# Patient Record
Sex: Female | Born: 1980 | Race: White | Hispanic: No | Marital: Married | Smoking: Never smoker
Health system: Southern US, Community
[De-identification: ages and names within clinical notes are randomized; demographics above are authoritative.]

## PROBLEM LIST (undated history)

## (undated) ENCOUNTER — Inpatient Hospital Stay (HOSPITAL_COMMUNITY): Payer: Self-pay

## (undated) DIAGNOSIS — Z87440 Personal history of urinary (tract) infections: Secondary | ICD-10-CM

## (undated) DIAGNOSIS — Z789 Other specified health status: Secondary | ICD-10-CM

## (undated) DIAGNOSIS — O34519 Maternal care for incarceration of gravid uterus, unspecified trimester: Secondary | ICD-10-CM

## (undated) DIAGNOSIS — O34539 Maternal care for retroversion of gravid uterus, unspecified trimester: Secondary | ICD-10-CM

## (undated) HISTORY — DX: Maternal care for incarceration of gravid uterus, unspecified trimester: O34.519

## (undated) HISTORY — DX: Maternal care for retroversion of gravid uterus, unspecified trimester: O34.539

## (undated) HISTORY — PX: WISDOM TOOTH EXTRACTION: SHX21

## (undated) HISTORY — PX: SKIN GRAFT: SHX250

## (undated) HISTORY — DX: Personal history of urinary (tract) infections: Z87.440

## (undated) HISTORY — PX: APPENDECTOMY: SHX54

---

## 2007-03-17 ENCOUNTER — Observation Stay (HOSPITAL_COMMUNITY): Admission: EM | Admit: 2007-03-17 | Discharge: 2007-03-18 | Payer: Self-pay | Admitting: Emergency Medicine

## 2007-03-17 ENCOUNTER — Encounter (INDEPENDENT_AMBULATORY_CARE_PROVIDER_SITE_OTHER): Payer: Self-pay | Admitting: General Surgery

## 2007-03-17 ENCOUNTER — Ambulatory Visit (HOSPITAL_COMMUNITY): Admission: RE | Admit: 2007-03-17 | Discharge: 2007-03-17 | Payer: Self-pay | Admitting: Family Medicine

## 2009-10-10 ENCOUNTER — Ambulatory Visit (HOSPITAL_COMMUNITY): Admission: RE | Admit: 2009-10-10 | Discharge: 2009-10-10 | Payer: Self-pay | Admitting: Family Medicine

## 2010-07-04 ENCOUNTER — Inpatient Hospital Stay (HOSPITAL_COMMUNITY)
Admission: AD | Admit: 2010-07-04 | Discharge: 2010-07-08 | Payer: Self-pay | Source: Home / Self Care | Attending: Obstetrics & Gynecology | Admitting: Obstetrics & Gynecology

## 2010-07-08 LAB — RPR: RPR Ser Ql: NONREACTIVE

## 2010-07-08 LAB — CBC
Hemoglobin: 11.5 g/dL — ABNORMAL LOW (ref 12.0–15.0)
MCH: 30.5 pg (ref 26.0–34.0)
MCHC: 34.4 g/dL (ref 30.0–36.0)
Platelets: 217 10*3/uL (ref 150–400)
RBC: 3.77 MIL/uL — ABNORMAL LOW (ref 3.87–5.11)
WBC: 10.5 10*3/uL (ref 4.0–10.5)

## 2010-07-09 LAB — CBC
Hemoglobin: 9.9 g/dL — ABNORMAL LOW (ref 12.0–15.0)
MCHC: 33.3 g/dL (ref 30.0–36.0)
MCV: 90.8 fL (ref 78.0–100.0)
Platelets: 188 10*3/uL (ref 150–400)

## 2010-10-29 NOTE — Op Note (Signed)
Morgan Lawrence, Morgan Lawrence               ACCOUNT NO.:  [id number]   MEDICAL RECORD NO.:  [id number]          PATIENT TYPE:  INP   LOCATION:  A330                          FACILITY:  [facility]   PHYSICIAN:  Dalia Heading, M.D.  DATE OF BIRTH:  [birth year]   DATE OF PROCEDURE:  03/17/2007  DATE OF DISCHARGE:                               OPERATIVE REPORT   PREOPERATIVE DIAGNOSIS:  Acute appendicitis.   POSTOPERATIVE DIAGNOSIS:  Acute appendicitis.   PROCEDURE:  Laparoscopic appendectomy.   SURGEON:  Dr. Franky Macho.   ANESTHESIA:  General endotracheal.   INDICATIONS:  The patient is a 30 year old white female who presents  with of right lower quadrant abdominal pain secondary to acute  appendicitis.  This was this was confirmed by CT scan of the abdomen and  pelvis.  The risks and benefits of the procedure including bleeding,  infection, the possibility an open procedure were fully explained to the  patient, gave informed consent.   PROCEDURE NOTE:  The patient was placed in supine position.  After  induction of general endotracheal anesthesia, the abdomen was prepped  and draped using usual sterile technique with Chloraprep.  Surgical site  confirmation was performed.   An infraumbilical incision was made down to the fascia.  The Veress  needle was introduced to the abdominal cavity and confirmation of  placement was done using the saline drop test.  The abdomen was then  insufflated to 16 mmHg pressure.  11 mm trocar was introduced into the  abdominal cavity under direct visualization without difficulty.  The  patient was placed deeper Trendelenburg position.  Additional 11-mm  trocar was placed in suprapubic region and a 5-mm trocar was placed left  lower quadrant region.  The appendix was visualized and noted to be  retrocecal in nature.  The distal half the appendix was noted to be  inflamed and the vessels injected.  There was no evidence of  perforation.  The mesoappendix was  divided using the harmonic scalpel.  The vascular Endo-GIA was placed across the base of the appendix and  fired.  The appendix was removed without difficulty using EndoCatch bag.  The staple line was inspected, noted to be normal limits.  All fluid and  air were then evacuated from the abdominal cavity prior to move the  trocars.   All wounds were irrigated normal saline.  All wounds were dressed with  0.5 cm Sensorcaine.  The infraumbilical fascia as well as suprapubic  fascia reapproximated using 0 Vicryl interrupted sutures.  All skin  incisions were closed using staples.  Neosporin ointment and dry sterile  dressings were applied.   All tape and needle counts correct end the procedure.  The patient was  extubated in the operating room went back to recovery room awake in  stable condition.  Complications none.   SPECIMEN:  Appendix.   BLOOD LOSS:  Minimal.      Dalia Heading, M.D.  Electronically Signed     MAJ/MEDQ  D:  03/17/2007  T:  03/18/2007  Job:  [id number]   cc:   Madelin Rear.  Sherwood Gambler, MD  Fax: [phone]

## 2011-03-27 LAB — DIFFERENTIAL
Basophils Absolute: 0
Lymphocytes Relative: 48 — ABNORMAL HIGH
Lymphs Abs: 3.3
Monocytes Absolute: 0.5
Monocytes Relative: 7
Neutro Abs: 2.9

## 2011-03-27 LAB — URINALYSIS, ROUTINE W REFLEX MICROSCOPIC
Glucose, UA: NEGATIVE
Ketones, ur: NEGATIVE
Leukocytes, UA: NEGATIVE
Nitrite: NEGATIVE
Urobilinogen, UA: 0.2

## 2011-03-27 LAB — COMPREHENSIVE METABOLIC PANEL
ALT: 13
Albumin: 4.4
Alkaline Phosphatase: 42
CO2: 28
GFR calc non Af Amer: >60
Glucose, Bld: 96
Sodium: 138
Total Bilirubin: 0.5
Total Protein: 7.6

## 2011-03-27 LAB — URINE MICROSCOPIC-ADD ON

## 2011-03-27 LAB — CBC
HCT: 36.3
Hemoglobin: 12.3
MCV: 88.5

## 2011-03-27 LAB — PREGNANCY, URINE: Preg Test, Ur: NEGATIVE

## 2012-02-23 LAB — OB RESULTS CONSOLE RPR: RPR: NONREACTIVE

## 2012-02-23 LAB — OB RESULTS CONSOLE HEPATITIS B SURFACE ANTIGEN: Hepatitis B Surface Ag: NEGATIVE

## 2012-02-23 LAB — OB RESULTS CONSOLE GC/CHLAMYDIA: Chlamydia: NEGATIVE

## 2012-02-26 ENCOUNTER — Encounter (HOSPITAL_COMMUNITY): Payer: Self-pay

## 2012-02-26 ENCOUNTER — Inpatient Hospital Stay (HOSPITAL_COMMUNITY)
Admission: AD | Admit: 2012-02-26 | Discharge: 2012-02-26 | Disposition: A | Discharge status: Home / Self Care | Payer: 59 | Source: Ambulatory Visit | Attending: Obstetrics and Gynecology | Admitting: Obstetrics and Gynecology

## 2012-02-26 DIAGNOSIS — O99891 Other specified diseases and conditions complicating pregnancy: Secondary | ICD-10-CM | POA: Insufficient documentation

## 2012-02-26 DIAGNOSIS — R109 Unspecified abdominal pain: Secondary | ICD-10-CM | POA: Insufficient documentation

## 2012-02-26 DIAGNOSIS — R339 Retention of urine, unspecified: Secondary | ICD-10-CM | POA: Insufficient documentation

## 2012-02-26 HISTORY — DX: Other specified health status: Z78.9

## 2012-02-26 LAB — URINALYSIS, ROUTINE W REFLEX MICROSCOPIC
Glucose, UA: NEGATIVE mg/dL
Leukocytes, UA: NEGATIVE
Specific Gravity, Urine: 1.01 (ref 1.005–1.030)
pH: 5.5 (ref 5.0–8.0)

## 2012-02-26 LAB — URINE MICROSCOPIC-ADD ON: WBC, UA: NONE SEEN WBC/hpf (ref <3)

## 2012-02-26 NOTE — MAU Note (Signed)
I&O cath done using sterile technique.  1200 cc's clear yellow urine returned.

## 2012-02-26 NOTE — MAU Note (Signed)
Patient states she has been unable to urinate since 1800 last night. Is having abdominal pain. Denies any bleeding or discharge. 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

## 2012-02-26 NOTE — MAU Provider Note (Signed)
History     CSN: [id number]  Arrival date and time: 02/26/12 1325   None     Chief Complaint  Patient presents with  . Abdominal Pain   HPI 31 y.o. G2P1001 at [redacted]w[redacted]d with difficulty urinating x a few weeks, states her uterus is tilted, has not been able to urinate since about 6 PM last night.    Past Medical History  Diagnosis Date  . No pertinent past medical history     Past Surgical History  Procedure Date  . Appendectomy   . Skin graft   . Wisdom tooth extraction     History reviewed. No pertinent family history.  History  Substance Use Topics  . Smoking status: Never Smoker   . Smokeless tobacco: Not on file  . Alcohol Use: No    Allergies: Allergies not on file  No prescriptions prior to admission    Review of Systems  Constitutional: Negative.   Respiratory: Negative.   Cardiovascular: Negative.   Gastrointestinal: Positive for abdominal pain (relieved with emptying bladder). Negative for nausea, vomiting, diarrhea and constipation.  Genitourinary: Negative for dysuria, urgency, frequency, hematuria and flank pain.       Negative for vaginal bleeding, Positive for urinary hesitancy and retention  Musculoskeletal: Negative.   Neurological: Negative.   Psychiatric/Behavioral: Negative.    Physical Exam   Blood pressure 103/62, pulse 81, temperature 98.4 F (36.9 C), temperature source Oral, resp. rate 16, height 5\' 6"  (1.676 m), weight 128 lb 3.2 oz (58.151 kg), SpO2 99.00%.  Physical Exam  Nursing note and vitals reviewed. Constitutional: She is oriented to person, place, and time. She appears well-developed and well-nourished. No distress.  Cardiovascular: Normal rate.   Respiratory: Effort normal.  GI: Soft. There is no tenderness.  Musculoskeletal: Normal range of motion.  Neurological: She is alert and oriented to person, place, and time.  Skin: Skin is warm and dry.  Psychiatric: She has a normal mood and affect.    MAU Course    Procedures  Results for orders placed during the hospital encounter of 02/26/12 (from the past 24 hour(s))  URINALYSIS, ROUTINE W REFLEX MICROSCOPIC     Status: Abnormal   Collection Time   02/26/12  2:20 PM      Component Value Range   Color, Urine YELLOW  YELLOW   APPearance CLEAR  CLEAR   Specific Gravity, Urine 1.010  1.005 - 1.030   pH 5.5  5.0 - 8.0   Glucose, UA NEGATIVE  NEGATIVE mg/dL   Hgb urine dipstick SMALL (*) NEGATIVE   Bilirubin Urine NEGATIVE  NEGATIVE   Ketones, ur NEGATIVE  NEGATIVE mg/dL   Protein, ur NEGATIVE  NEGATIVE mg/dL   Urobilinogen, UA 0.2  0.0 - 1.0 mg/dL   Nitrite NEGATIVE  NEGATIVE   Leukocytes, UA NEGATIVE  NEGATIVE  URINE MICROSCOPIC-ADD ON     Status: Normal   Collection Time   02/26/12  2:20 PM      Component Value Range   Squamous Epithelial / LPF RARE  RARE   WBC, UA    <3 WBC/hpf   Value: NO FORMED ELEMENTS SEEN ON URINE MICROSCOPIC EXAMINATION   RBC / HPF 0-2  <3 RBC/hpf   Bacteria, UA RARE  RARE   Bladder drained of 1200 ml of urine upon arrival to MAU with in and out cath  Assessment and Plan  31 y.o. G2P1001 at [redacted]w[redacted]d Urinary retention - foley catheter placed, follow up in office on Monday  [facility]  02/26/2012, 2:35 PM

## 2012-02-28 ENCOUNTER — Encounter (HOSPITAL_COMMUNITY): Payer: Self-pay | Admitting: *Deleted

## 2012-02-28 ENCOUNTER — Encounter (HOSPITAL_COMMUNITY): Payer: Self-pay | Admitting: Anesthesiology

## 2012-02-28 ENCOUNTER — Inpatient Hospital Stay (HOSPITAL_COMMUNITY): Payer: 59

## 2012-02-28 ENCOUNTER — Inpatient Hospital Stay (HOSPITAL_COMMUNITY)
Admission: AD | Admit: 2012-02-28 | Discharge: 2012-02-28 | Disposition: A | Discharge status: Home / Self Care | Payer: 59 | Source: Ambulatory Visit | Attending: Obstetrics and Gynecology | Admitting: Obstetrics and Gynecology

## 2012-02-28 ENCOUNTER — Encounter (HOSPITAL_COMMUNITY)
Admission: AD | Disposition: A | Discharge status: Home / Self Care | Payer: Self-pay | Source: Ambulatory Visit | Attending: Obstetrics and Gynecology

## 2012-02-28 ENCOUNTER — Inpatient Hospital Stay (HOSPITAL_COMMUNITY): Payer: 59 | Admitting: Anesthesiology

## 2012-02-28 DIAGNOSIS — Z3480 Encounter for supervision of other normal pregnancy, unspecified trimester: Secondary | ICD-10-CM

## 2012-02-28 DIAGNOSIS — O34519 Maternal care for incarceration of gravid uterus, unspecified trimester: Secondary | ICD-10-CM

## 2012-02-28 HISTORY — PX: CERVICAL CERCLAGE: SHX1329

## 2012-02-28 LAB — CBC
HCT: 29.2 % — ABNORMAL LOW (ref 36.0–46.0)
MCV: 86.1 fL (ref 78.0–100.0)
Platelets: 192 10*3/uL (ref 150–400)
RBC: 3.39 MIL/uL — ABNORMAL LOW (ref 3.87–5.11)
WBC: 7.5 10*3/uL (ref 4.0–10.5)

## 2012-02-28 SURGERY — CERCLAGE, CERVIX, VAGINAL APPROACH
Anesthesia: Spinal | Wound class: Clean Contaminated

## 2012-02-28 MED ORDER — LIDOCAINE IN DEXTROSE 5-7.5 % IV SOLN
INTRAVENOUS | Status: DC | PRN
  Administered 2012-02-28: 60 mg via INTRATHECAL

## 2012-02-28 MED ORDER — FENTANYL CITRATE 0.05 MG/ML IJ SOLN: 25.0000 ug | INTRAMUSCULAR | Status: DC | PRN

## 2012-02-28 MED ORDER — FAMOTIDINE IN NACL 20-0.9 MG/50ML-% IV SOLN
20.0000 mg | Freq: Once | INTRAVENOUS | Status: AC
  Administered 2012-02-28: 20 mg via INTRAVENOUS
  Filled 2012-02-28: qty 50

## 2012-02-28 MED ORDER — CITRIC ACID-SODIUM CITRATE 334-500 MG/5ML PO SOLN
30.0000 mL | Freq: Once | ORAL | Status: AC
  Administered 2012-02-28: 30 mL via ORAL
  Filled 2012-02-28: qty 15

## 2012-02-28 MED ORDER — KETOROLAC TROMETHAMINE 30 MG/ML IJ SOLN: 15.0000 mg | Freq: Once | INTRAMUSCULAR | Status: DC | PRN

## 2012-02-28 MED ORDER — LIDOCAINE IN DEXTROSE 5-7.5 % IV SOLN
INTRAVENOUS | Status: AC
  Filled 2012-02-28: qty 2

## 2012-02-28 MED ORDER — LACTATED RINGERS IV SOLN
INTRAVENOUS | Status: DC
  Administered 2012-02-28: 05:00:00 via INTRAVENOUS

## 2012-02-28 MED ORDER — LACTATED RINGERS IV SOLN
INTRAVENOUS | Status: DC | PRN
  Administered 2012-02-28 (×2): via INTRAVENOUS

## 2012-02-28 SURGICAL SUPPLY — 20 items
CATH FOLEY 2WAY SLVR 30CC 16FR (CATHETERS) IMPLANT
CATH ROBINSON RED A/P 16FR (CATHETERS) IMPLANT
CLOTH BEACON ORANGE TIMEOUT ST (SAFETY) ×2 IMPLANT
COUNTER NEEDLE 1200 MAGNETIC (NEEDLE) IMPLANT
GLOVE ECLIPSE 7.0 STRL STRAW (GLOVE) ×4 IMPLANT
GOWN PREVENTION PLUS LG XLONG (DISPOSABLE) ×2 IMPLANT
GOWN PREVENTION PLUS XLARGE (GOWN DISPOSABLE) ×2 IMPLANT
NEEDLE MAYO .5 CIRCLE (NEEDLE) IMPLANT
NEEDLE SPNL 22GX3.5 QUINCKE BK (NEEDLE) IMPLANT
NS IRRIG 1000ML POUR BTL (IV SOLUTION) ×2 IMPLANT
PACK VAGINAL MINOR WOMEN LF (CUSTOM PROCEDURE TRAY) ×2 IMPLANT
PAD OB MATERNITY 4.3X12.25 (PERSONAL CARE ITEMS) ×2 IMPLANT
PAD PREP 24X48 CUFFED NSTRL (MISCELLANEOUS) IMPLANT
SUT PROLENE 1 CT 1 30 (SUTURE) IMPLANT
SYR 30ML LL (SYRINGE) IMPLANT
SYR CONTROL 10ML LL (SYRINGE) IMPLANT
TOWEL OR 17X24 6PK STRL BLUE (TOWEL DISPOSABLE) ×4 IMPLANT
TUBING NON-CON 1/4 X 20 CONN (TUBING) IMPLANT
WATER STERILE IRR 1000ML POUR (IV SOLUTION) IMPLANT
YANKAUER SUCT BULB TIP NO VENT (SUCTIONS) IMPLANT

## 2012-02-28 NOTE — Progress Notes (Signed)
Unable to hear in Triage due to full bladder.

## 2012-02-28 NOTE — MAU Note (Signed)
Pt felt when she leaned back in hosp. Bed that she may be able to void. Bed pan and large blue pad in to pt. Will try to void either on bed pan or large blue pad. Spouse with pt and supportive.

## 2012-02-28 NOTE — MAU Note (Signed)
Pt still unable to void sitting on bedpan or blue pad. Misty Stanley CNM aware.

## 2012-02-28 NOTE — Anesthesia Procedure Notes (Signed)
Spinal  Patient location during procedure: OR Start time: 02/28/2012 5:12 AM Staffing Performed by: anesthesiologist  Preanesthetic Checklist Completed: patient identified, site marked, surgical consent, pre-op evaluation, timeout performed, IV checked, risks and benefits discussed and monitors and equipment checked Spinal Block Patient position: sitting Prep: site prepped and draped and DuraPrep Patient monitoring: heart rate, continuous pulse ox and blood pressure Approach: midline Location: L3-4 Injection technique: single-shot Additional Notes Clear free flow CSF on first attempt.  No paresthesia.  Patient tolerated procedure well.  Jasmine December, MD
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
## 2012-02-28 NOTE — Progress Notes (Signed)
Dr Dareen Piano in. Bimanual done. Pt tol well.

## 2012-02-28 NOTE — MAU Note (Signed)
Dr Dareen Piano called into MAU and would like OB u/s

## 2012-02-28 NOTE — H&P (Signed)
Pt is a 31 year old white female, G2P1001 at 13 weeks who presents to the ER c/o urinary retention.  Pt was seen in the ER 2 days ago with similar complaints. She was noted to have an incarcerated uterus. She 1200 cc in her bladder. She had a catheter placed and was discharged. She had the catheter removed in the office on Friday. An attempt was made to relieve the incarceration without success. PE: Thin white female in NAD.        Abd- non tender, no masses        Pelvic- Cervix deviated anteriorly.                     Uterus- c/w 13 weeks Ultrasound-RV 13 week IUP IMP/ Urinary retention secondary to obstruction from incarcerated uterus. Plan/ To OR for reduction of incarcerated uterus.

## 2012-02-28 NOTE — Progress Notes (Signed)
To OR via stretcher. Spouse to waiting room.

## 2012-02-28 NOTE — MAU Note (Signed)
I was here Thurs. Unable to void. Had catheter inserted and went home with it. Saw Dr Arlyce Dice this am and he tried to tilt the uterus alittle which helped some. Able to void this evening until up to Br at 2300 and unable to void

## 2012-02-28 NOTE — Anesthesia Postprocedure Evaluation (Signed)
Anesthesia Post Note  Patient: Morgan Lawrence  Procedure(s) Performed: Procedure(s) (LRB): CERCLAGE CERVICAL (N/A)  Anesthesia type: Spinal  Patient location: PACU  Post pain: Pain level controlled  Post assessment: Post-op Vital signs reviewed  Last Vitals:  Filed Vitals:   02/28/12 0800  BP: 104/53  Pulse: 66  Temp: 37.3 C  Resp: 20    Post vital signs: Reviewed  Level of consciousness: awake  Complications: No apparent anesthesia complications

## 2012-02-28 NOTE — MAU Provider Note (Signed)
Chief Complaint: Urinary Retention   First Provider Initiated Contact with Patient 02/28/12 0144     SUBJECTIVE HPI: Morgan Lawrence is a 31 y.o. G2P1001 at [redacted]w[redacted]d by LMP who presents to maternity admissions reporting urinary retention.  She was seen in MAU for this on Thursday and had a Foley catheter placed.  Dr Arlyce Dice removed her catheter and manual repositioned her uterus on Friday in the office.  She was able to urinate at home until 9 pm but has been unable to go since then and is starting to become uncomfortable.  She reports some vaginal spotting since the procedure in the office today, but denies cramping, LOF, heavy vaginal bleeding, h/a, dizziness, or fever/chills.     Past Medical History  Diagnosis Date  . No pertinent past medical history    Past Surgical History  Procedure Date  . Appendectomy   . Skin graft   . Wisdom tooth extraction    History   Social History  . Marital Status: Married    Spouse Name: N/A    Number of Children: N/A  . Years of Education: N/A   Occupational History  . Not on file.   Social History Main Topics  . Smoking status: Never Smoker   . Smokeless tobacco: Not on file  . Alcohol Use: No  . Drug Use: No  . Sexually Active: Yes   Other Topics Concern  . Not on file   Social History Narrative  . No narrative on file   No current facility-administered medications on file prior to encounter.   Current Outpatient Prescriptions on File Prior to Encounter  Medication Sig Dispense Refill  . Prenatal Vit-Fe Fumarate-FA (PRENATAL MULTIVITAMIN) TABS Take 1 tablet by mouth at bedtime.       Allergies  Allergen Reactions  . Bactrim (Sulfamethoxazole W-Trimethoprim) Hives  . Penicillins Hives  . Shellfish Allergy Hives    ROS: Pertinent items in HPI  OBJECTIVE Blood pressure 119/48, pulse 74, temperature 98.1 F (36.7 C), temperature source Oral, resp. rate 20, height 5' 5.5" (1.664 m), weight 59.512 kg (131 lb 3.2 oz). GENERAL:  Well-developed, well-nourished female in no acute distress.  HEENT: Normocephalic HEART: normal rate RESP: normal effort ABDOMEN: Soft, non-tender EXTREMITIES: Nontender, no edema NEURO: Alert and oriented Pelvic exam deferred  FHT 160 by doppler  IMAGING US Ob Comp Less 14 Wks  02/28/2012  *RADIOLOGY REPORT*  Clinical Data: Negative F H T  OBSTETRIC <14 WK ULTRASOUND  Technique:  Transabdominal ultrasound was performed for evaluation of the gestation as well as the maternal uterus and adnexal regions.  Comparison:  None.  Intrauterine gestational sac: Visualized/normal in shape. Yolk sac: Not identified Embryo: Identified Cardiac Activity: Identified Heart Rate: 160 bpm  CRL:  6.97 mm  13 w  2 d            [place] EDC: 09/02/2012  Maternal uterus/Adnexae: No subchorionic hemorrhage.  Normal sonographic appearance to the ovaries.  Uterus is retroflexed.  IMPRESSION: Single intrauterine gestation with cardiac activity documented. Estimated age of 13 weeks 2 days by crown-rump length.  Recommend followup with non-emergent complete OB 14+ wk US examination for fetal biometric evaluation and anatomic survey if not already scheduled.   Original Report Authenticated By: Waneta Martins, M.D.     ASSESSMENT Incarcerated uterus @13 .[redacted] weeks gestation  PLAN Foley catheter placed--800 ml yellow urine  Called Dr Dareen Piano to discuss assessment and findings OB U/S ordered Dr Dareen Piano to MAU to see pt and  assume care Pt to OR for uterine manipulation/repositioning under anesthesia    Medication List     As of 02/28/2012  3:27 AM    ASK your doctor about these medications         prenatal multivitamin Tabs   Take 1 tablet by mouth at bedtime.         Sharen Counter Certified Nurse-Midwife 02/28/2012  3:27 AM

## 2012-02-28 NOTE — Transfer of Care (Signed)
Immediate Anesthesia Transfer of Care Note  Patient: Morgan Lawrence  Procedure(s) Performed: Procedure(s) (LRB) with comments: CERCLAGE CERVICAL (N/A) - Exam Under Anesthesia  Patient Location: PACU  Anesthesia Type: Spinal  Level of Consciousness: awake, alert  and oriented  Airway & Oxygen Therapy: Patient Spontanous Breathing  Post-op Assessment: Report given to PACU RN  Post vital signs: Reviewed and stable  Complications: No apparent anesthesia complications

## 2012-03-01 ENCOUNTER — Encounter (HOSPITAL_COMMUNITY): Payer: Self-pay | Admitting: Obstetrics and Gynecology

## 2012-03-05 NOTE — Op Note (Signed)
NAMEANISSAH, KEMPER NO.:  [id number]  MEDICAL RECORD NO.:  [id number]  LOCATION:  [facility]                          FACILITY:  [facility]  PHYSICIAN:  Malva Limes, M.D.    DATE OF BIRTH:  1980-07-03  DATE OF PROCEDURE:  02/28/2012 DATE OF DISCHARGE:  02/28/2012                              OPERATIVE REPORT   PREOPERATIVE DIAGNOSIS:  Incarcerated uterus at 13 weeks estimated gestational age.  POSTOPERATIVE DIAGNOSIS:  Incarcerated uterus at 13 weeks estimated gestational age.  PROCEDURE:  Inversion of uterus and resolution of incarceration.  SURGEON:  Malva Limes, MD  ANESTHESIA:  Spinal.  ANTIBIOTICS:  None.  DRAINS:  Red rubber catheter to bladder.  COMPLICATIONS:  None.  ESTIMATED BLOOD LOSS:  None.  PROCEDURE:  The patient was taken to the operating room, where a spinal anesthetic was placed without difficulty.  She was then placed in dorsal lithotomy position.  She was prepped and draped in the usual fashion for this procedure.  Her bladder was drained with a red rubber catheter. Pelvic exam was then performed, which revealed the uterus approximately 14 weeks in size.  It was extremely retroverted with the cervix up near the pubic symphysis.  My hand was placed into the posterior vagina.  The uterus was then manually lifted up out of the pelvis and inverted.  The patient was noted to have a prominent sacrum.  Procedure was performed without difficulty.  The patient had very little pain.  Postoperatively, there were fetal heart tones.  The patient was discharged to home. Instructed to call the office with any difficulty voiding.          ______________________________ Malva Limes, M.D.     MA/MEDQ  D:  03/04/2012  T:  03/05/2012  Job:  [id number]

## 2012-06-16 NOTE — L&D Delivery Note (Signed)
Delivery Note At 9:01 PM a viable and healthy female was delivered via Vaginal, Spontaneous Delivery (Presentation: Left Occiput Posterior).  APGAR: 7, 9; weight pending.   Placenta status: Intact, Spontaneous.  Cord: 3 vessels with a loose nuchal cord x 1.  Anesthesia: Epidural  Episiotomy: None Lacerations: None Suture Repair: None Est. Blood Loss (mL): 300 cc  Mom to postpartum.  Baby to nursery-stable.  Navraj Dreibelbis H. 09/02/2012, 9:21 PM

## 2012-08-30 ENCOUNTER — Telehealth (HOSPITAL_COMMUNITY): Payer: Self-pay | Admitting: *Deleted

## 2012-08-30 ENCOUNTER — Encounter (HOSPITAL_COMMUNITY): Payer: Self-pay | Admitting: *Deleted

## 2012-08-30 NOTE — Telephone Encounter (Signed)
Preadmission screen  

## 2012-09-02 ENCOUNTER — Encounter (HOSPITAL_COMMUNITY): Payer: Self-pay

## 2012-09-02 ENCOUNTER — Inpatient Hospital Stay (HOSPITAL_COMMUNITY): Payer: 59 | Admitting: Anesthesiology

## 2012-09-02 ENCOUNTER — Inpatient Hospital Stay (HOSPITAL_COMMUNITY)
Admission: RE | Admit: 2012-09-02 | Discharge: 2012-09-04 | DRG: 775 | Disposition: A | Discharge status: Home / Self Care | Payer: 59 | Source: Ambulatory Visit | Attending: Obstetrics and Gynecology | Admitting: Obstetrics and Gynecology

## 2012-09-02 ENCOUNTER — Encounter (HOSPITAL_COMMUNITY): Payer: Self-pay | Admitting: Anesthesiology

## 2012-09-02 DIAGNOSIS — O34519 Maternal care for incarceration of gravid uterus, unspecified trimester: Principal | ICD-10-CM | POA: Diagnosis present

## 2012-09-02 LAB — CBC
Hemoglobin: 10.9 g/dL — ABNORMAL LOW (ref 12.0–15.0)
MCH: 31.2 pg (ref 26.0–34.0)
MCV: 90.8 fL (ref 78.0–100.0)
RBC: 3.49 MIL/uL — ABNORMAL LOW (ref 3.87–5.11)

## 2012-09-02 LAB — RPR: RPR Ser Ql: NONREACTIVE

## 2012-09-02 MED ORDER — OXYCODONE-ACETAMINOPHEN 5-325 MG PO TABS: 1.0000 | ORAL_TABLET | ORAL | Status: DC | PRN

## 2012-09-02 MED ORDER — FENTANYL 2.5 MCG/ML BUPIVACAINE 1/10 % EPIDURAL INFUSION (WH - ANES)
14.0000 mL/h | INTRAMUSCULAR | Status: DC | PRN
  Administered 2012-09-02: 14 mL/h via EPIDURAL
  Filled 2012-09-02: qty 125

## 2012-09-02 MED ORDER — ONDANSETRON HCL 4 MG PO TABS: 4.0000 mg | ORAL_TABLET | ORAL | Status: DC | PRN

## 2012-09-02 MED ORDER — OXYTOCIN 40 UNITS IN LACTATED RINGERS INFUSION - SIMPLE MED: 62.5000 mL/h | INTRAVENOUS | Status: DC

## 2012-09-02 MED ORDER — SENNOSIDES-DOCUSATE SODIUM 8.6-50 MG PO TABS
2.0000 | ORAL_TABLET | Freq: Every day | ORAL | Status: DC
  Administered 2012-09-02: 2 via ORAL
  Administered 2012-09-03: 1 via ORAL

## 2012-09-02 MED ORDER — METHYLERGONOVINE MALEATE 0.2 MG/ML IJ SOLN: 0.2000 mg | INTRAMUSCULAR | Status: DC | PRN

## 2012-09-02 MED ORDER — ZOLPIDEM TARTRATE 5 MG PO TABS: 5.0000 mg | ORAL_TABLET | Freq: Every evening | ORAL | Status: DC | PRN

## 2012-09-02 MED ORDER — DIPHENHYDRAMINE HCL 50 MG/ML IJ SOLN: 12.5000 mg | INTRAMUSCULAR | Status: DC | PRN

## 2012-09-02 MED ORDER — IBUPROFEN 600 MG PO TABS
600.0000 mg | ORAL_TABLET | Freq: Four times a day (QID) | ORAL | Status: DC
  Administered 2012-09-03 – 2012-09-04 (×5): 600 mg via ORAL
  Filled 2012-09-02 (×5): qty 1

## 2012-09-02 MED ORDER — EPHEDRINE 5 MG/ML INJ
10.0000 mg | INTRAVENOUS | Status: DC | PRN
  Filled 2012-09-02: qty 4

## 2012-09-02 MED ORDER — CITRIC ACID-SODIUM CITRATE 334-500 MG/5ML PO SOLN: 30.0000 mL | ORAL | Status: DC | PRN

## 2012-09-02 MED ORDER — PHENYLEPHRINE 40 MCG/ML (10ML) SYRINGE FOR IV PUSH (FOR BLOOD PRESSURE SUPPORT)
80.0000 ug | PREFILLED_SYRINGE | INTRAVENOUS | Status: DC | PRN
  Filled 2012-09-02: qty 5

## 2012-09-02 MED ORDER — METHYLERGONOVINE MALEATE 0.2 MG PO TABS: 0.2000 mg | ORAL_TABLET | ORAL | Status: DC | PRN

## 2012-09-02 MED ORDER — LIDOCAINE HCL (PF) 1 % IJ SOLN
30.0000 mL | INTRAMUSCULAR | Status: DC | PRN
  Filled 2012-09-02 (×3): qty 30

## 2012-09-02 MED ORDER — DIPHENHYDRAMINE HCL 25 MG PO CAPS: 25.0000 mg | ORAL_CAPSULE | Freq: Four times a day (QID) | ORAL | Status: DC | PRN

## 2012-09-02 MED ORDER — SIMETHICONE 80 MG PO CHEW: 80.0000 mg | CHEWABLE_TABLET | ORAL | Status: DC | PRN

## 2012-09-02 MED ORDER — ONDANSETRON HCL 4 MG/2ML IJ SOLN: 4.0000 mg | INTRAMUSCULAR | Status: DC | PRN

## 2012-09-02 MED ORDER — WITCH HAZEL-GLYCERIN EX PADS: 1.0000 "application " | MEDICATED_PAD | CUTANEOUS | Status: DC | PRN

## 2012-09-02 MED ORDER — TERBUTALINE SULFATE 1 MG/ML IJ SOLN: 0.2500 mg | Freq: Once | INTRAMUSCULAR | Status: DC | PRN

## 2012-09-02 MED ORDER — PRENATAL MULTIVITAMIN CH
1.0000 | ORAL_TABLET | Freq: Every day | ORAL | Status: DC
  Administered 2012-09-03: 1 via ORAL
  Filled 2012-09-02: qty 1

## 2012-09-02 MED ORDER — LACTATED RINGERS IV SOLN
500.0000 mL | INTRAVENOUS | Status: DC | PRN
  Administered 2012-09-02 (×2): 1000 mL via INTRAVENOUS

## 2012-09-02 MED ORDER — ONDANSETRON HCL 4 MG/2ML IJ SOLN: 4.0000 mg | Freq: Four times a day (QID) | INTRAMUSCULAR | Status: DC | PRN

## 2012-09-02 MED ORDER — PHENYLEPHRINE 40 MCG/ML (10ML) SYRINGE FOR IV PUSH (FOR BLOOD PRESSURE SUPPORT): 80.0000 ug | PREFILLED_SYRINGE | INTRAVENOUS | Status: DC | PRN

## 2012-09-02 MED ORDER — ACETAMINOPHEN 325 MG PO TABS: 650.0000 mg | ORAL_TABLET | ORAL | Status: DC | PRN

## 2012-09-02 MED ORDER — OXYTOCIN 40 UNITS IN LACTATED RINGERS INFUSION - SIMPLE MED
1.0000 m[IU]/min | INTRAVENOUS | Status: DC
  Administered 2012-09-02: 2 m[IU]/min via INTRAVENOUS
  Filled 2012-09-02: qty 1000

## 2012-09-02 MED ORDER — DIBUCAINE 1 % RE OINT
1.0000 "application " | TOPICAL_OINTMENT | RECTAL | Status: DC | PRN
  Filled 2012-09-02: qty 28

## 2012-09-02 MED ORDER — EPHEDRINE 5 MG/ML INJ: 10.0000 mg | INTRAVENOUS | Status: DC | PRN

## 2012-09-02 MED ORDER — LANOLIN HYDROUS EX OINT: TOPICAL_OINTMENT | CUTANEOUS | Status: DC | PRN

## 2012-09-02 MED ORDER — OXYTOCIN BOLUS FROM INFUSION
500.0000 mL | INTRAVENOUS | Status: DC
  Administered 2012-09-02: 500 mL via INTRAVENOUS

## 2012-09-02 MED ORDER — LACTATED RINGERS IV SOLN
INTRAVENOUS | Status: DC
  Administered 2012-09-02: 1000 mL via INTRAVENOUS

## 2012-09-02 MED ORDER — TETANUS-DIPHTH-ACELL PERTUSSIS 5-2.5-18.5 LF-MCG/0.5 IM SUSP
0.5000 mL | Freq: Once | INTRAMUSCULAR | Status: AC
  Administered 2012-09-04: 0.5 mL via INTRAMUSCULAR
  Filled 2012-09-02: qty 0.5

## 2012-09-02 MED ORDER — LACTATED RINGERS IV SOLN: 500.0000 mL | Freq: Once | INTRAVENOUS | Status: DC

## 2012-09-02 MED ORDER — IBUPROFEN 600 MG PO TABS: 600.0000 mg | ORAL_TABLET | Freq: Four times a day (QID) | ORAL | Status: DC | PRN

## 2012-09-02 MED ORDER — BUTORPHANOL TARTRATE 1 MG/ML IJ SOLN: 1.0000 mg | INTRAMUSCULAR | Status: DC | PRN

## 2012-09-02 MED ORDER — SODIUM BICARBONATE 8.4 % IV SOLN
INTRAVENOUS | Status: DC | PRN
  Administered 2012-09-02: 5 mL via EPIDURAL

## 2012-09-02 MED ORDER — BENZOCAINE-MENTHOL 20-0.5 % EX AERO
1.0000 "application " | INHALATION_SPRAY | CUTANEOUS | Status: DC | PRN
  Filled 2012-09-02: qty 56

## 2012-09-02 NOTE — H&P (Signed)
Morgan Lawrence is a 32 y.o. female presenting for IOL  32 yo G2P1001 @ 39+6 presents for elective IOL.  Her pregnancy was complicated by uterine incarceration around 14 weeks.  She ended up having to go to the OR for decompression of incarcerated uterus. Otherwise, her pregnancy has been uneventful. History OB History   Grav Para Term Preterm Abortions TAB SAB Ect Mult Living   2 1 1       1      Past Medical History  Diagnosis Date  . No pertinent past medical history   . Hx: UTI (urinary tract infection)   . Retroverted and incarcerated gravid uterus, unspecified as to episode of care    Past Surgical History  Procedure Laterality Date  . Appendectomy    . Skin graft    . Wisdom tooth extraction    . Cervical cerclage  02/28/2012    Procedure: CERCLAGE CERVICAL;  Surgeon: Levi Aland, MD;  Location: [facility];  Service: Gynecology;  Laterality: N/A;  Exam Under Anesthesia, Correction of uterine Incarceration  NOTE: Patient did NOT have a cerclage placed this pregnancy.  She had an EUA for incarcerated uterus.  The PSH is incorrect and cannot be amended   Family History: family history is negative for Other. Social History:  reports that she has never smoked. She does not have any smokeless tobacco history on file. She reports that she does not drink alcohol or use illicit drugs.   Prenatal Transfer Tool  Maternal Diabetes: No Genetic Screening: Normal Maternal Ultrasounds/Referrals: Normal Fetal Ultrasounds or other Referrals:  None Maternal Substance Abuse:  No Significant Maternal Medications:  None Significant Maternal Lab Results:  None Other Comments:  None  ROS: as above  Dilation: 10 Effacement (%): 100 Station: +1 Exam by:: JFoley, RN Blood pressure 105/90, pulse 92, temperature 98.1 F (36.7 C), temperature source Oral, resp. rate 18, height 5\' 5"  (1.651 m), weight 71.668 kg (158 lb), SpO2 100.00%. Exam Physical Exam  Prenatal labs: ABO, Rh: --/--/A NEG, A  NEG (03/20 0715) Antibody: NEG (03/20 0715) Rubella: Immune (09/09 0000) RPR: NON REACTIVE (03/20 0715)  HBsAg: Negative (09/09 0000)  HIV: Non-reactive (09/09 0000)  GBS: Negative (02/25 0000)   Assessment/Plan: 1) Admit 2) Pitocin augmentation 3) Epidural on request   [name] 09/02/2012, 9:25 PM

## 2012-09-02 NOTE — Anesthesia Procedure Notes (Signed)

## 2012-09-02 NOTE — Anesthesia Preprocedure Evaluation (Signed)

## 2012-09-03 ENCOUNTER — Inpatient Hospital Stay (HOSPITAL_COMMUNITY): Admission: AD | Admit: 2012-09-03 | Payer: Self-pay | Source: Ambulatory Visit | Admitting: Obstetrics & Gynecology

## 2012-09-03 LAB — CBC
Hemoglobin: 9.6 g/dL — ABNORMAL LOW (ref 12.0–15.0)
MCHC: 33.9 g/dL (ref 30.0–36.0)
Platelets: 208 10*3/uL (ref 150–400)

## 2012-09-03 LAB — CCBB MATERNAL DONOR DRAW

## 2012-09-03 NOTE — Anesthesia Postprocedure Evaluation (Signed)
  Anesthesia Post-op Note  Patient: Morgan Lawrence  Procedure(s) Performed: * No procedures listed *  Patient Location: PACU  Anesthesia Type:Epidural  Level of Consciousness: awake, alert , oriented and patient cooperative  Airway and Oxygen Therapy: Patient Spontanous Breathing  Post-op Pain: none  Post-op Assessment: Post-op Vital signs reviewed and Patient's Cardiovascular Status Stable  Post-op Vital Signs: Reviewed and stable  Complications: No apparent anesthesia complications

## 2012-09-03 NOTE — Progress Notes (Signed)
Patient is eating, ambulating, voiding.  Pain control is good.  Filed Vitals:   09/02/12 2232 09/02/12 2330 09/03/12 0030 09/03/12 0412  BP: 112/67 115/74 110/71 110/70  Pulse: 104 120 88 78  Temp:  98.2 F (36.8 C) 98.9 F (37.2 C) 98.1 F (36.7 C)  TempSrc:  Oral Oral Oral  Resp:  20 18 18   Height:      Weight:      SpO2:  97% 97% 98%    Fundus firm Perineum without swelling.  Lab Results  Component Value Date   WBC 19.2* 09/03/2012   HGB 9.6* 09/03/2012   HCT 28.3* 09/03/2012   MCV 91.6 09/03/2012   PLT 208 09/03/2012    --/--/A NEG, A NEG (03/20 0715)/RI  A/P Post partum day one.  Routine care.  Expect d/c tomorrow.   Baby is O neg; no need for Rhogam.  Iron otc. Sherial Ebrahim A

## 2012-09-03 NOTE — Discharge Summary (Signed)
Obstetric Discharge Summary Reason for Admission: onset of labor Prenatal Procedures: none Intrapartum Procedures: spontaneous vaginal delivery Postpartum Procedures: none Complications-Operative and Postpartum: none Hemoglobin  Date Value Range Status  09/03/2012 9.6* 12.0 - 15.0 g/dL Final     HCT  Date Value Range Status  09/03/2012 28.3* 36.0 - 46.0 % Final     Discharge Diagnoses: Term Pregnancy-delivered  Discharge Information: Date: 09/03/2012 Activity: pelvic rest Diet: routine Medications: Ibuprofen and Iron Condition: stable Instructions: refer to practice specific booklet Discharge to: home Follow-up Information   Follow up with Almon Hercules., MD. Schedule an appointment as soon as possible for a visit in 4 weeks.   Contact information:   [address] [place] [address] [phone]       Newborn Data: Live born female  Birth Weight: 9 lb 8.6 oz (4325 g) APGAR: 7, 9  Home with mother.  Idris Edmundson A 09/03/2012, 7:47 AM

## 2012-09-04 NOTE — Progress Notes (Signed)
Patient is eating, ambulating, voiding.  Pain control is good.  Filed Vitals:   09/03/12 0030 09/03/12 0412 09/03/12 1217 09/03/12 1823  BP: 110/71 110/70 101/70 109/69  Pulse: 88 78 99 65  Temp: 98.9 F (37.2 C) 98.1 F (36.7 C) 97.7 F (36.5 C) 97.5 F (36.4 C)  TempSrc: Oral Oral Oral Oral  Resp: 18 18 18 18   Height:      Weight:      SpO2: 97% 98% 98% 98%    Fundus firm Perineum without swelling.  Lab Results  Component Value Date   WBC 19.2* 09/03/2012   HGB 9.6* 09/03/2012   HCT 28.3* 09/03/2012   MCV 91.6 09/03/2012   PLT 208 09/03/2012    --/--/A NEG, A NEG (03/20 0715)/RI Baby Neg  A/P Post partum day 2.  Routine care.  Expect d/c today.    Morgan Lawrence A

## 2012-09-06 NOTE — Progress Notes (Signed)
Ur chart review completed.  

## 2012-12-03 ENCOUNTER — Other Ambulatory Visit: Payer: Self-pay | Admitting: Obstetrics and Gynecology

## 2013-02-17 IMAGING — US US OB COMP LESS 14 WK
1 series · 14 of 23 positions shown · non-contrast
Comparison: None.

CLINICAL DATA: Negative F H T

OBSTETRIC <14 WK ULTRASOUND
TECHNIQUE: Transabdominal ultrasound was performed for evaluation
of the gestation as well as the maternal uterus and adnexal
regions.

[Series 1: us ob comp less 14 wks · 14 of 23 slices shown]
[im 1/23]
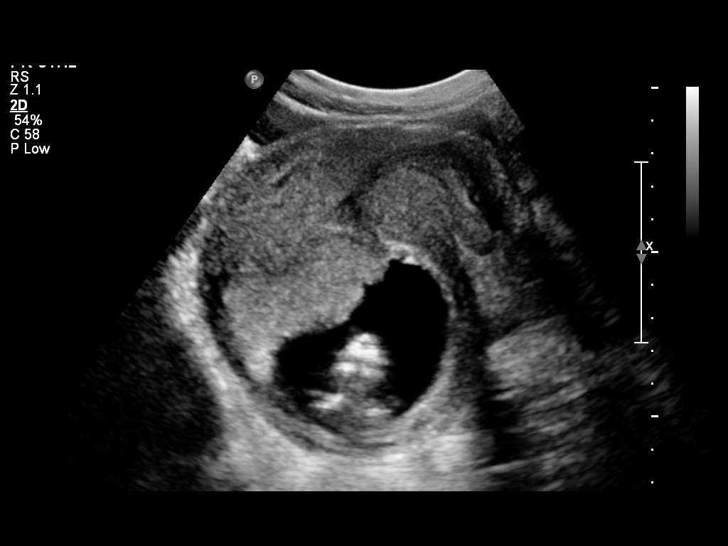
[im 3/23]
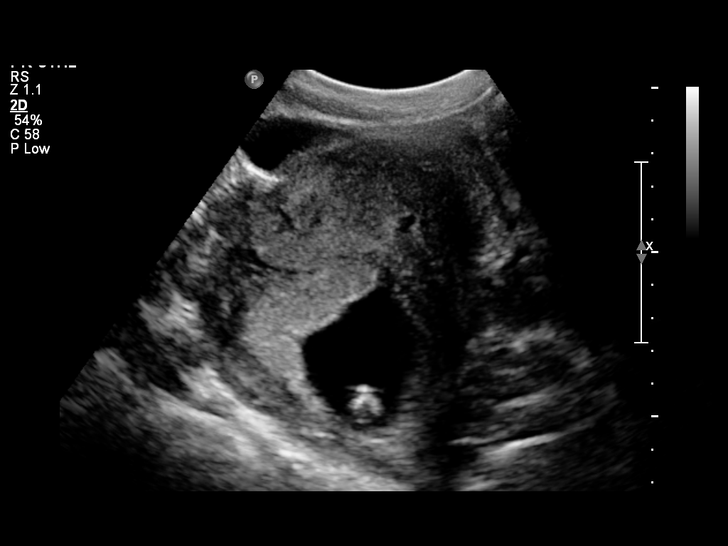
[im 5/23]
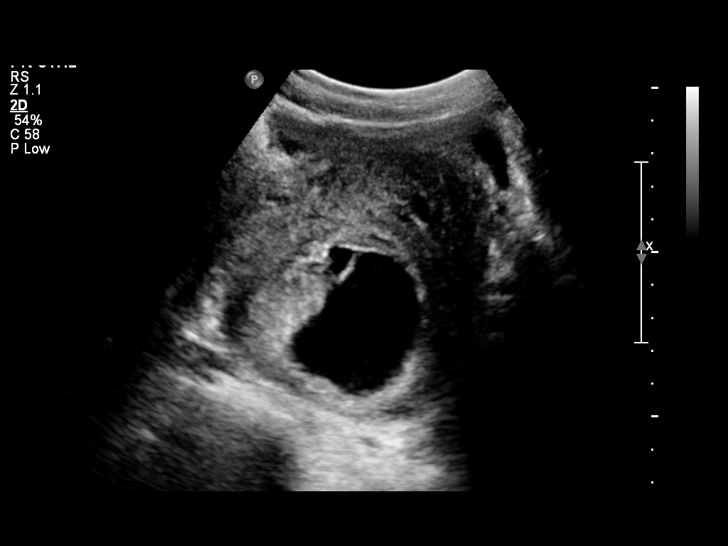
[im 6/23]
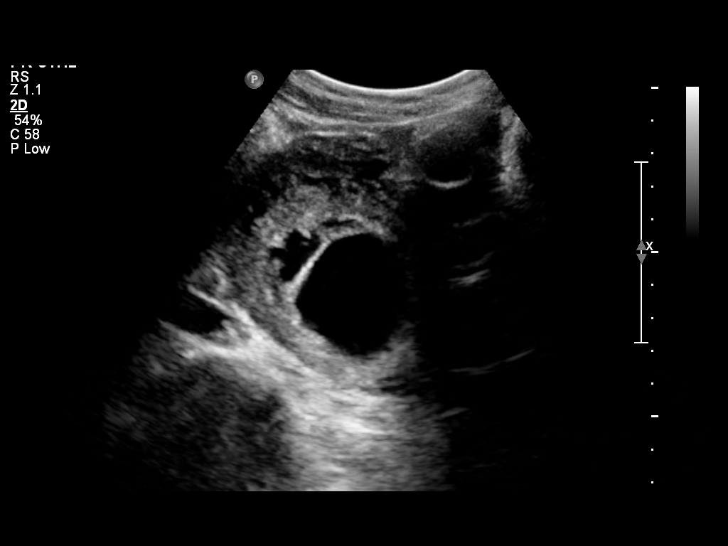
[im 8/23]
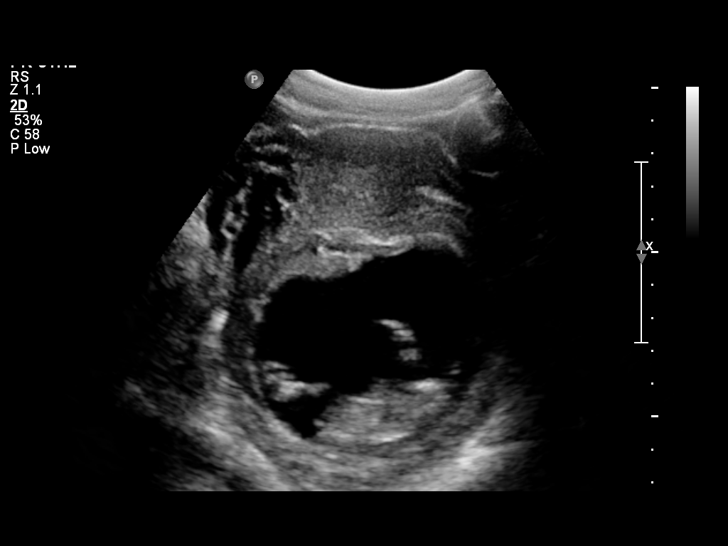
[im 10/23]
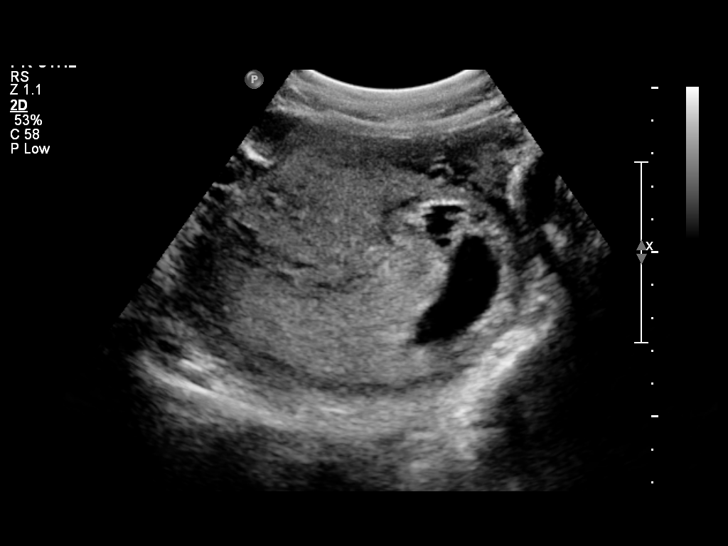
[im 11/23]
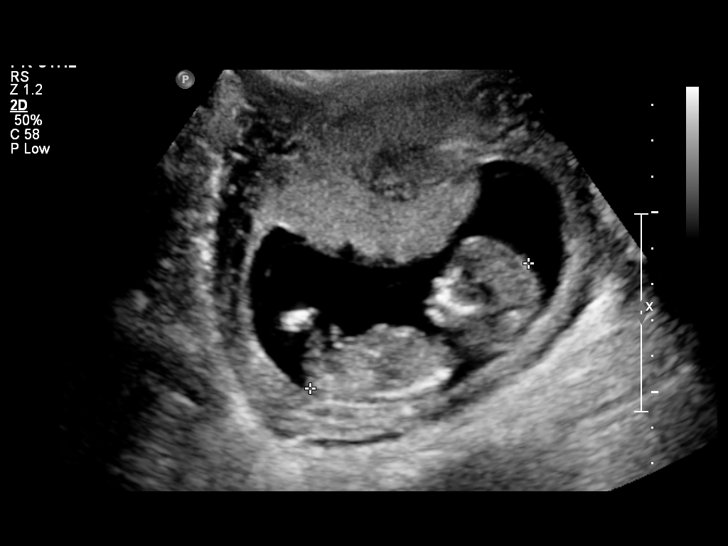
[im 13/23]
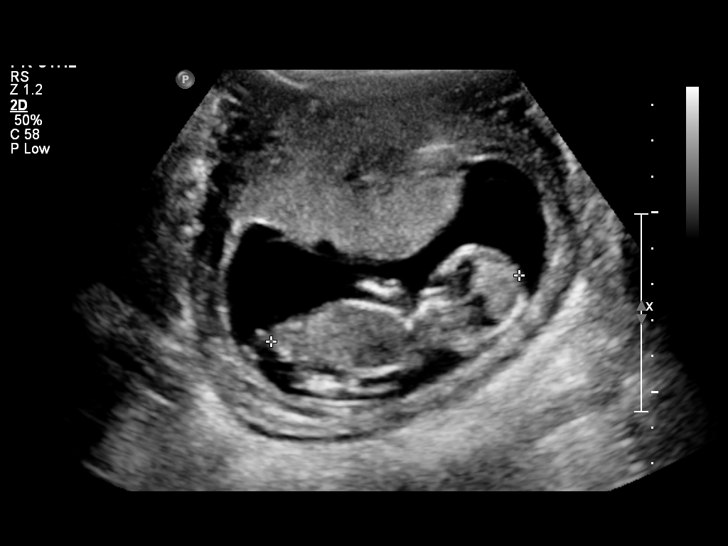
[im 14/23]
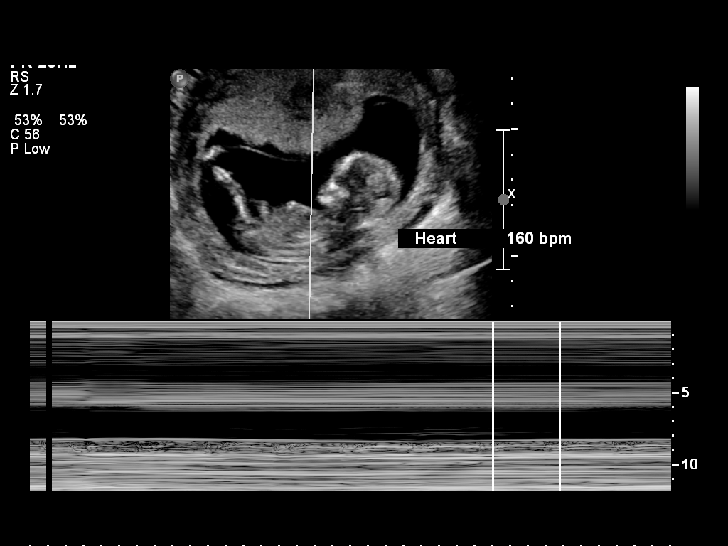
[im 16/23]
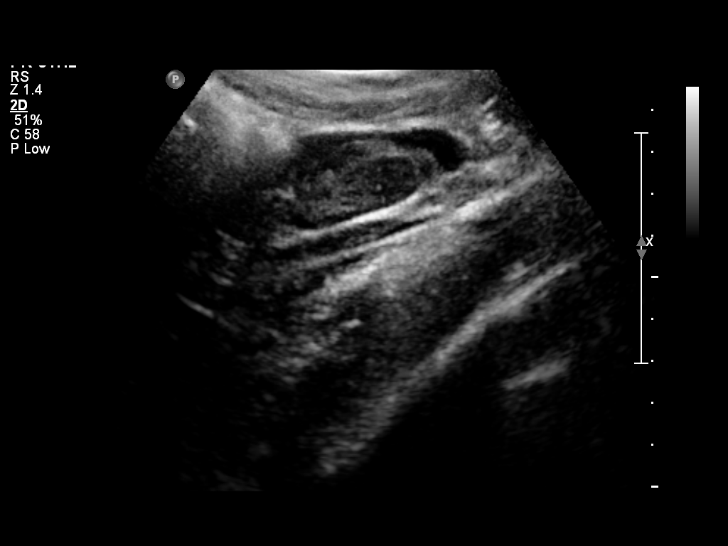
[im 18/23]
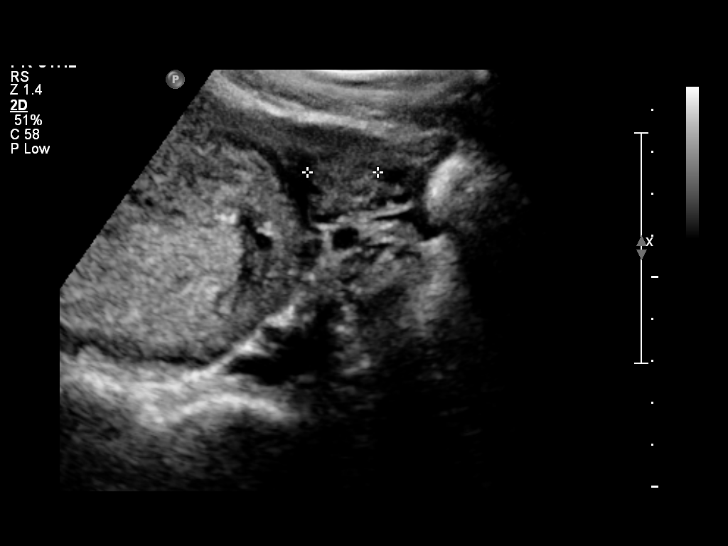
[im 19/23]
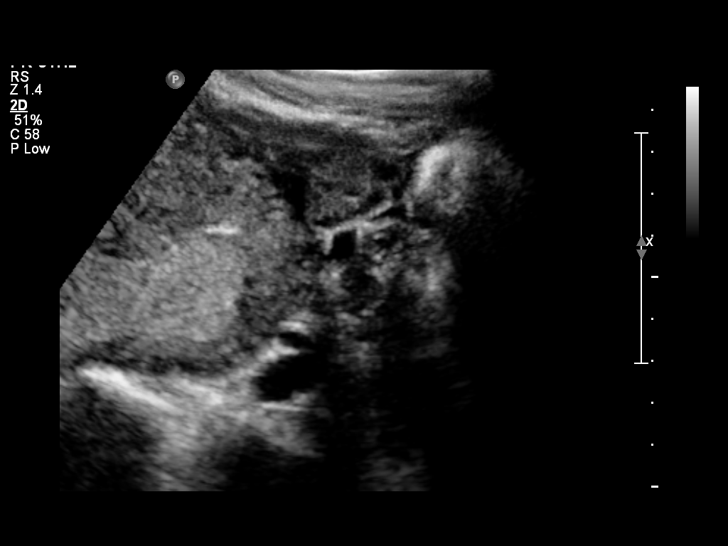
[im 21/23]
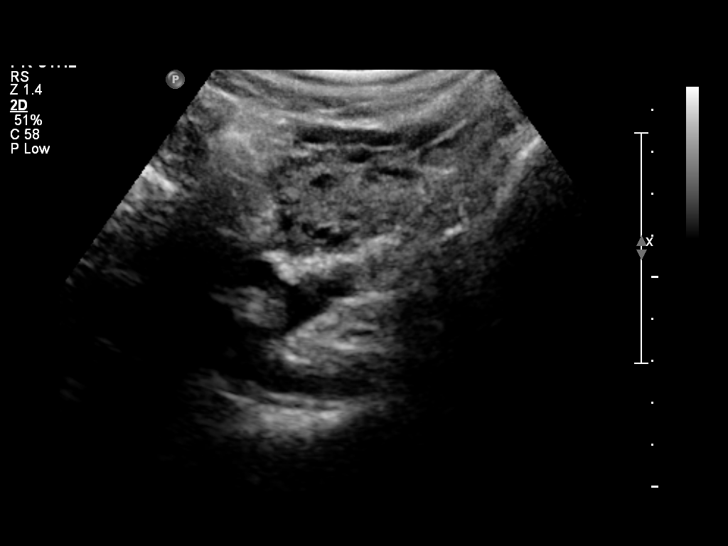
[im 23/23]
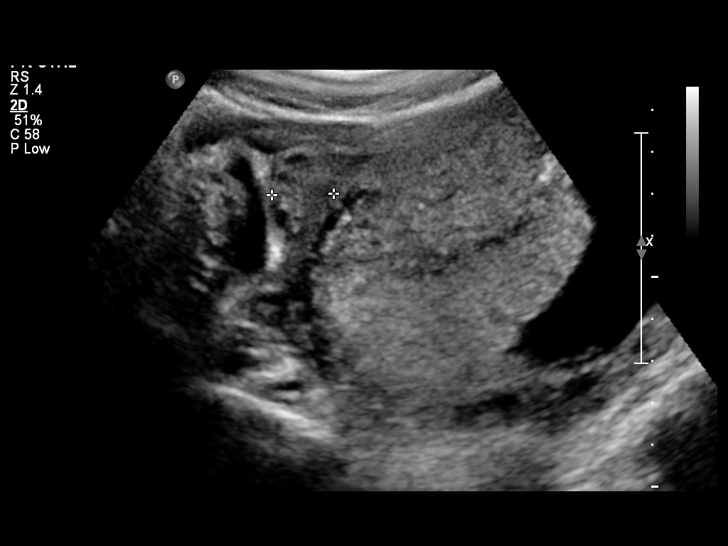

[14 of 23 positions shown; findings below may reference images not displayed]

Intrauterine gestational sac: Visualized/normal in shape.
Yolk sac: Not identified
Embryo: Identified
Cardiac Activity: Identified
Heart Rate: 160 bpm

CRL:  6.97 mm  13 w  2 d            US EDC: 09/02/2012

Maternal uterus/Adnexae:
No subchorionic hemorrhage.  Normal sonographic appearance to the
ovaries.  Uterus is retroflexed.
IMPRESSION: Single intrauterine gestation with cardiac activity documented.
Estimated age of 13 weeks 2 days by crown-rump length.

Recommend followup with non-emergent complete OB 14+ wk US
examination for fetal biometric evaluation and anatomic survey if
not already scheduled.

## 2014-04-17 ENCOUNTER — Encounter (HOSPITAL_COMMUNITY): Payer: Self-pay

## 2015-05-28 ENCOUNTER — Ambulatory Visit (INDEPENDENT_AMBULATORY_CARE_PROVIDER_SITE_OTHER): Payer: 59 | Admitting: Family Medicine

## 2015-05-28 ENCOUNTER — Encounter: Payer: Self-pay | Admitting: Family Medicine

## 2015-05-28 VITALS — Temp 98.7°F | Wt 128.0 lb

## 2015-05-28 DIAGNOSIS — J329 Chronic sinusitis, unspecified: Secondary | ICD-10-CM | POA: Diagnosis not present

## 2015-05-28 MED ORDER — CLARITHROMYCIN 500 MG PO TABS: 500.0000 mg | ORAL_TABLET | Freq: Two times a day (BID) | ORAL | Status: AC

## 2015-05-28 MED ORDER — BENZONATATE 100 MG PO CAPS: 100.0000 mg | ORAL_CAPSULE | Freq: Three times a day (TID) | ORAL | Status: AC | PRN

## 2015-05-28 NOTE — Progress Notes (Signed)
   Subjective:    Patient ID: Morgan Lawrence, female    DOB: [birth year], 34 y.o.   MRN: [id number]  Cough This is a new problem. The current episode started 1 to 4 weeks ago. Associated symptoms include ear pain, a fever, headaches, myalgias, nasal congestion and a sore throat. Associated symptoms comments: Vomiting and diarrhea.   Some fever.  Son had sickness before thanksgiving   Prod cough and gunky  Sig coughing and wheezing  Bad achiness in the muscles and joints  Aching, no sig vomiting   After t giving started with bad cough, at night    Review of Systems  Constitutional: Positive for fever.  HENT: Positive for ear pain and sore throat.   Respiratory: Positive for cough.   Musculoskeletal: Positive for myalgias.  Neurological: Positive for headaches.       Objective:   Physical Exam Alert vitals stable moderate malaise. HEENT moderate his congestion pharynx normal neck supple lungs no wheezes no crackles some bronchial sounds heart regular in rhythm       Assessment & Plan:  Impression post viral subacute rhinosinusitis/bronchitis plan antibiotics prescribed. Symptom care discussed warning signs discussed WSL

## 2015-06-12 ENCOUNTER — Telehealth: Payer: Self-pay | Admitting: Family Medicine

## 2015-06-12 ENCOUNTER — Ambulatory Visit (INDEPENDENT_AMBULATORY_CARE_PROVIDER_SITE_OTHER): Payer: 59 | Admitting: Family Medicine

## 2015-06-12 ENCOUNTER — Encounter: Payer: Self-pay | Admitting: Family Medicine

## 2015-06-12 ENCOUNTER — Ambulatory Visit (HOSPITAL_COMMUNITY)
Admission: RE | Admit: 2015-06-12 | Discharge: 2015-06-12 | Disposition: A | Discharge status: Home / Self Care | Payer: 59 | Source: Ambulatory Visit | Attending: Family Medicine | Admitting: Family Medicine

## 2015-06-12 VITALS — BP 102/70 | Temp 98.3°F | Wt 129.1 lb

## 2015-06-12 DIAGNOSIS — R05 Cough: Secondary | ICD-10-CM

## 2015-06-12 DIAGNOSIS — R0789 Other chest pain: Secondary | ICD-10-CM

## 2015-06-12 DIAGNOSIS — R059 Cough, unspecified: Secondary | ICD-10-CM

## 2015-06-12 DIAGNOSIS — J209 Acute bronchitis, unspecified: Secondary | ICD-10-CM

## 2015-06-12 MED ORDER — LEVOFLOXACIN 500 MG PO TABS: 500.0000 mg | ORAL_TABLET | Freq: Every day | ORAL | Status: AC

## 2015-06-12 NOTE — Progress Notes (Signed)
   Subjective:    Patient ID: Morgan Lawrence, female    DOB: Jan 22, 1981, 34 y.o.   MRN: [id number]  Cough This is a new problem. The current episode started more than 1 month ago. The problem has been unchanged. Associated symptoms include chest pain, ear pain, hemoptysis and a sore throat. Nothing aggravates the symptoms. Treatments tried: antibiotic. The treatment provided no relief.    Hx of baby pneumonia  Bad cough worse at night, not taking suppressan t,  Patient worried about right-sided chest pain. Sharp in nature. Worse with deep breath.  Review of Systems  HENT: Positive for ear pain and sore throat.   Respiratory: Positive for cough and hemoptysis.   Cardiovascular: Positive for chest pain.      Objective:   Physical Exam  Alert mild malaise. Vital stable. Lungs clear heart rare rhythm chest wall tender right anterior and right posterior      Assessment & Plan:  Current impression probable bronchitis with element of chest pain plan antibiotics prescribed. Chest x-ray performed no infiltrate symptoms care discussed WSL

## 2015-06-12 NOTE — Telephone Encounter (Signed)
Pt seen before Christmas, finished all meds, still has cough (producitve), ear pain, sore throat, has a protruding pain feeling when she's breathing, moving from side to side the right side of chest around to her back, sounds "bubbly" she still feels bad, night sweats but no known fever  Please advise

## 2015-06-12 NOTE — Telephone Encounter (Signed)
Pt coughing up green sputum and some blood. Transferred to front to schedule recheck today.

## 2016-06-01 IMAGING — DX DG CHEST 2V
2 series · 2 of 2 positions shown · non-contrast
Comparison: 10/10/2009

CLINICAL DATA: Cough, flu-like symptoms

EXAM:
CHEST  2 VIEW

[chest pa]
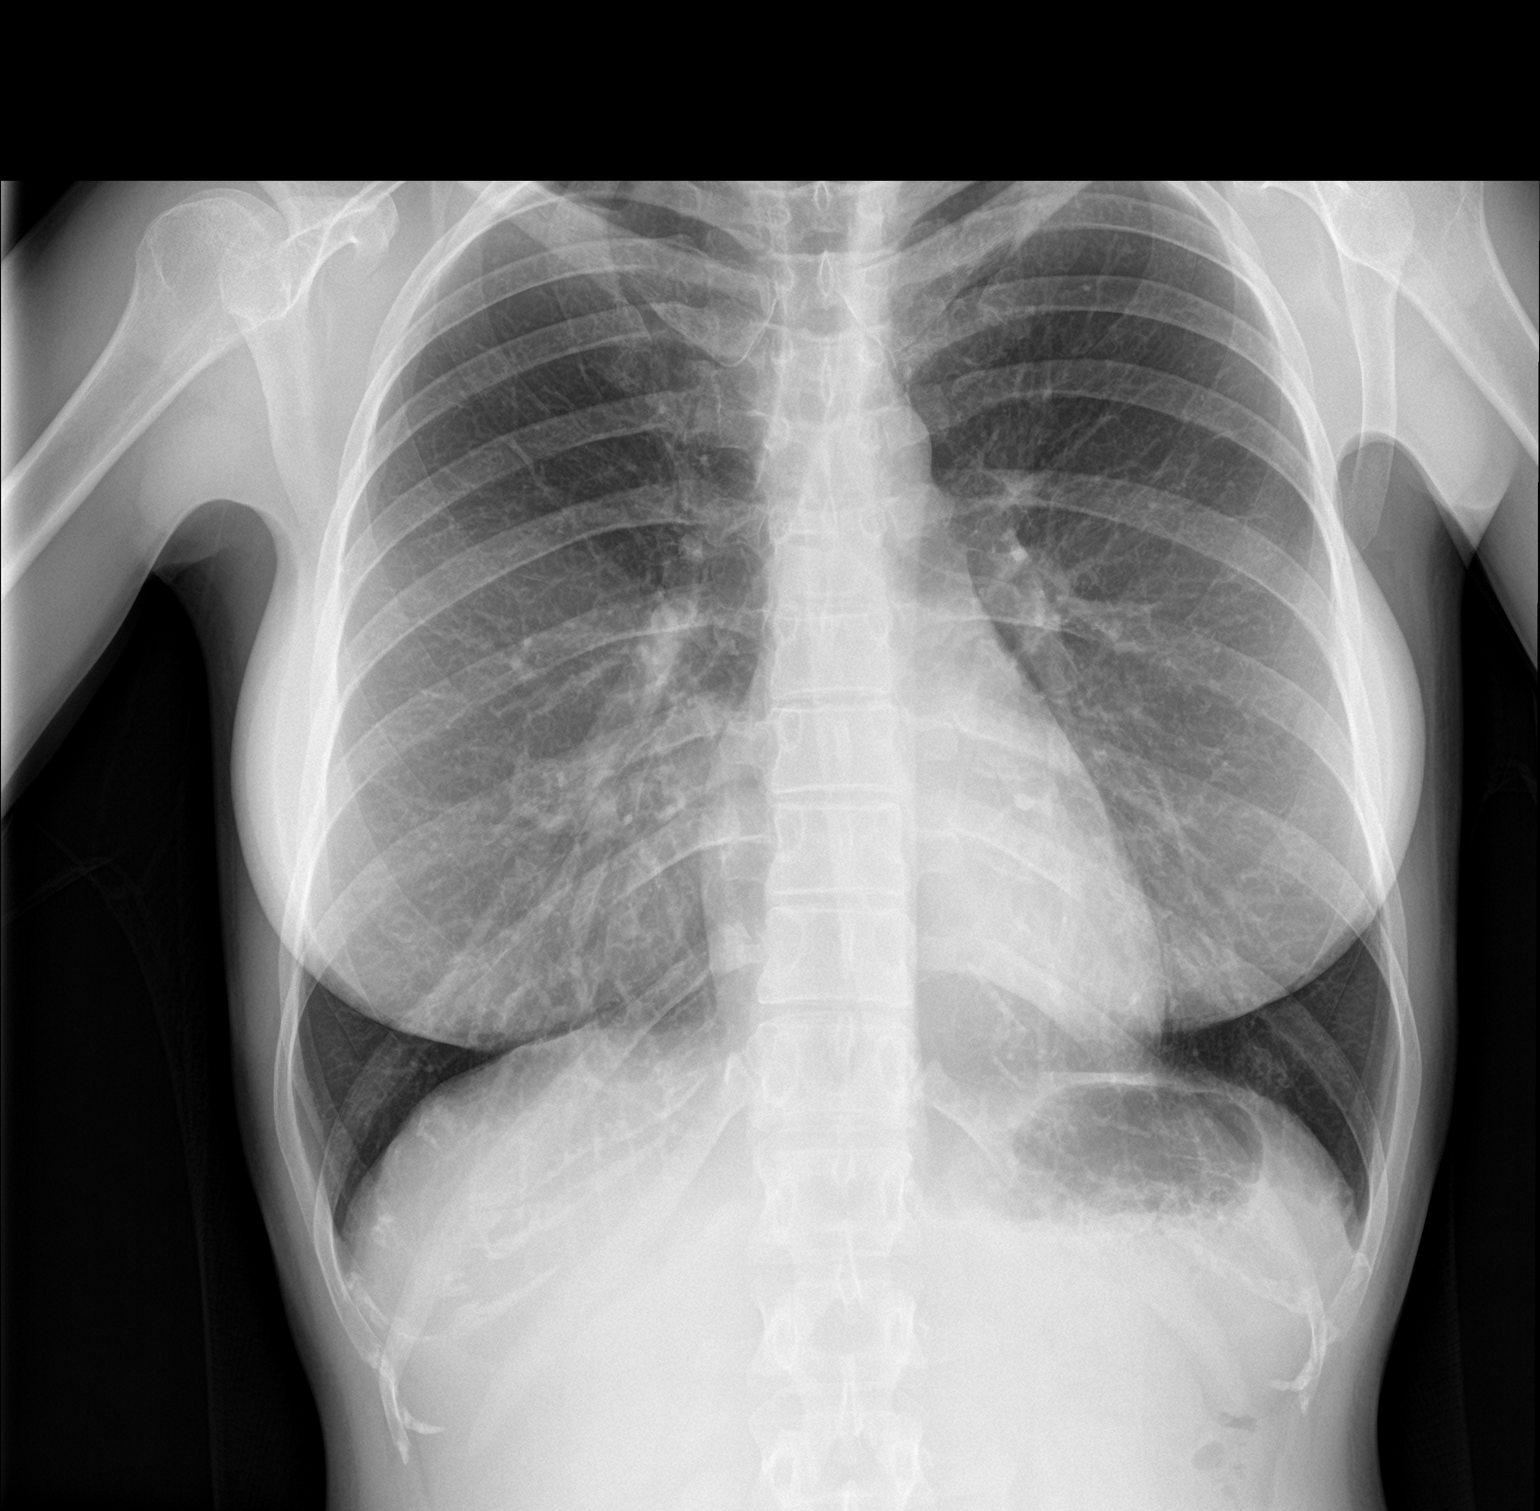

[chest lat]
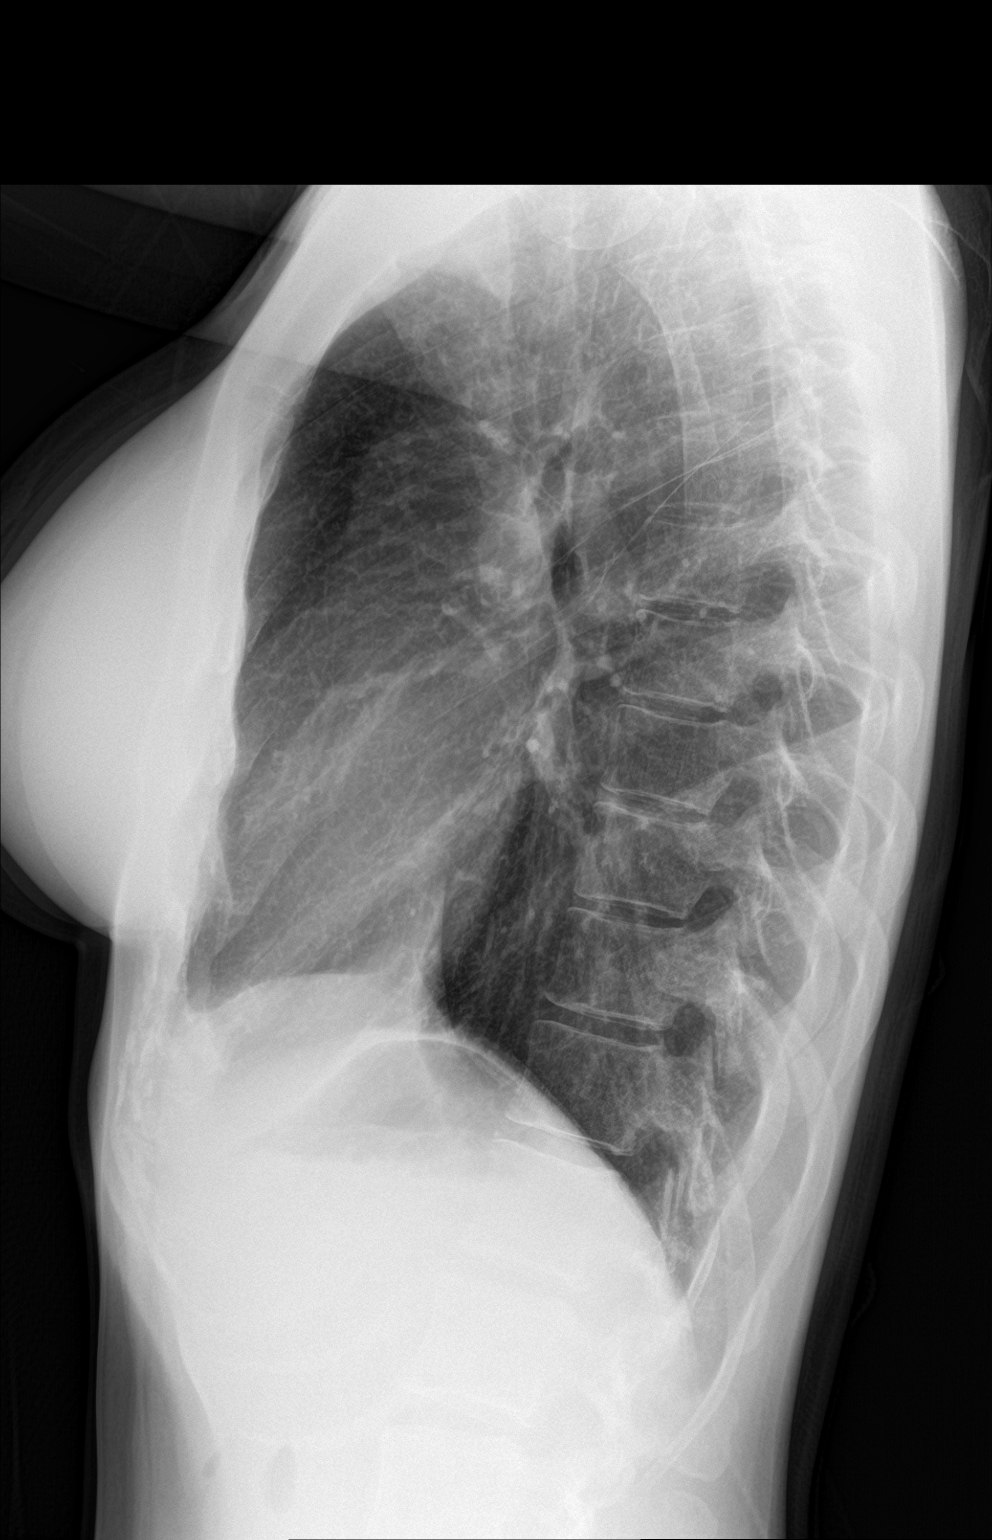

[2 of 2 positions shown; findings below may reference images not displayed]

FINDINGS: Lungs are clear.  No pleural effusion or pneumothorax.

The heart is normal in size.

Visualized osseous structures are within normal limits.
IMPRESSION: Normal chest radiographs.

## 2016-09-24 ENCOUNTER — Other Ambulatory Visit: Payer: Self-pay | Admitting: Obstetrics and Gynecology

## 2016-09-26 LAB — CYTOLOGY - PAP

## 2017-04-07 ENCOUNTER — Ambulatory Visit (INDEPENDENT_AMBULATORY_CARE_PROVIDER_SITE_OTHER): Payer: 59 | Admitting: *Deleted

## 2017-04-07 DIAGNOSIS — Z23 Encounter for immunization: Secondary | ICD-10-CM | POA: Diagnosis not present

## 2018-04-06 ENCOUNTER — Telehealth: Payer: Self-pay | Admitting: Family Medicine

## 2018-04-06 ENCOUNTER — Ambulatory Visit: Payer: 59

## 2018-04-06 NOTE — Telephone Encounter (Signed)
Pt returned call and informed that flu shots at [facility] are same as in the office. Pt states she will go to pharmacy to get flu shots. Pt verbalized understanding.

## 2018-04-06 NOTE — Telephone Encounter (Signed)
Patient would like to know if the flu shots that we carry are the same flu shots that [facility] carries, pt states she would like to  be worked in if possible within the month of October for her family of four, pt aware our next opening for flu shots would be beginning of November, her family was originally scheduled for 04/06/18, pt states she didn't want to go to the pharmacy and them not have the same strand of flu shot as our office, but doesn't want to wait until November to receive the flu shot as well. Advise mother with further information.

## 2018-04-06 NOTE — Telephone Encounter (Signed)
I called and left a message to r/c. 

## 2019-04-08 ENCOUNTER — Other Ambulatory Visit: Payer: Self-pay

## 2019-04-09 ENCOUNTER — Other Ambulatory Visit (INDEPENDENT_AMBULATORY_CARE_PROVIDER_SITE_OTHER): Payer: Self-pay

## 2019-04-09 DIAGNOSIS — Z23 Encounter for immunization: Secondary | ICD-10-CM

## 2019-07-18 ENCOUNTER — Encounter: Payer: Self-pay | Admitting: Family Medicine
